# Patient Record
Sex: Female | Born: 1980 | Race: White | Hispanic: No | Marital: Married | Smoking: Never smoker
Health system: Southern US, Community
[De-identification: ages and names within clinical notes are randomized; demographics above are authoritative.]

## PROBLEM LIST (undated history)

## (undated) DIAGNOSIS — Z803 Family history of malignant neoplasm of breast: Secondary | ICD-10-CM

## (undated) DIAGNOSIS — Z8041 Family history of malignant neoplasm of ovary: Secondary | ICD-10-CM

## (undated) HISTORY — DX: Morbid (severe) obesity due to excess calories: E66.01

## (undated) HISTORY — DX: Family history of malignant neoplasm of ovary: Z80.41

## (undated) HISTORY — DX: Family history of malignant neoplasm of breast: Z80.3

---

## 2005-01-16 ENCOUNTER — Other Ambulatory Visit: Admission: RE | Admit: 2005-01-16 | Discharge: 2005-01-16 | Payer: Self-pay | Admitting: Family Medicine

## 2006-03-13 ENCOUNTER — Emergency Department: Payer: Self-pay | Admitting: Unknown Physician Specialty

## 2006-03-24 ENCOUNTER — Other Ambulatory Visit: Admission: RE | Admit: 2006-03-24 | Discharge: 2006-03-24 | Payer: Self-pay | Admitting: Family Medicine

## 2006-12-03 ENCOUNTER — Observation Stay: Payer: Self-pay | Admitting: Obstetrics & Gynecology

## 2006-12-05 ENCOUNTER — Inpatient Hospital Stay: Payer: Self-pay

## 2009-05-05 ENCOUNTER — Inpatient Hospital Stay: Payer: Self-pay | Admitting: *Deleted

## 2010-03-24 ENCOUNTER — Observation Stay: Payer: Self-pay

## 2010-03-28 ENCOUNTER — Inpatient Hospital Stay: Payer: Self-pay

## 2010-04-09 ENCOUNTER — Ambulatory Visit: Payer: Self-pay | Admitting: Obstetrics & Gynecology

## 2013-03-25 HISTORY — PX: BARIATRIC SURGERY: SHX1103

## 2013-11-11 ENCOUNTER — Ambulatory Visit: Payer: Self-pay | Admitting: Specialist

## 2013-11-12 ENCOUNTER — Ambulatory Visit: Payer: Self-pay | Admitting: Specialist

## 2016-06-06 ENCOUNTER — Ambulatory Visit (INDEPENDENT_AMBULATORY_CARE_PROVIDER_SITE_OTHER): Payer: BC Managed Care – PPO | Admitting: Certified Nurse Midwife

## 2016-06-06 ENCOUNTER — Encounter: Payer: Self-pay | Admitting: Certified Nurse Midwife

## 2016-06-06 VITALS — BP 104/64 | HR 65 | Ht 66.0 in | Wt 187.0 lb

## 2016-06-06 DIAGNOSIS — Z30433 Encounter for removal and reinsertion of intrauterine contraceptive device: Secondary | ICD-10-CM

## 2016-06-06 DIAGNOSIS — Z9884 Bariatric surgery status: Secondary | ICD-10-CM

## 2016-06-06 DIAGNOSIS — Z309 Encounter for contraceptive management, unspecified: Secondary | ICD-10-CM | POA: Insufficient documentation

## 2016-06-06 MED ORDER — LEVONORGESTREL 20 MCG/24HR IU IUD: 1.0000 | INTRAUTERINE_SYSTEM | Freq: Once | INTRAUTERINE | 0 refills | Status: AC

## 2016-06-06 NOTE — Progress Notes (Signed)
    GYNECOLOGY OFFICE PROCEDURE NOTE  Joan Kelly is a 36 y.o. [phone] here for Mirena IUD replacement. No GYN concerns. She is amenorrheic on the Mirena which was inserted 5 years ago. Her current IUD is her third Mirena IUD.  Last pap smear was on January 2017 and was normal.  IUD Insertion Procedure Note Patient identified, informed consent performed, consent signed.   Discussed risks of irregular bleeding, cramping, infection, expulsion,or malpositioning  of the IUD.     On bimanual exam, uterus was retroflexed. Speculum placed in the vagina.  Cervix visualized. THe IUD strings were visible. They were grasped with a uterine packing forceps and the expiring IUD was removed intact. The cervix was then cleaned with Betadine x 3. Cervix was sprayed with Hurricaine anesthetic and  grasped posteriorly with a single tooth tenaculum.  Uterus sounded to 6 cm.  Mirena  IUD placed per manufacturer's recommendations.  Strings trimmed to 3 cm. Tenaculum was removed, and silver nitrate was applied to tenaculum sites for hemostasis.  Patient tolerated procedure well.   Patient was given post-procedure instructions.  She was advised to have backup contraception for one week.  Patient was also asked to check IUD strings periodically and follow up in 8 weeks for IUD check and annual exam  Joan Kelly, CNM 06/06/16

## 2016-08-08 ENCOUNTER — Ambulatory Visit: Payer: BC Managed Care – PPO | Admitting: Certified Nurse Midwife

## 2019-04-11 ENCOUNTER — Encounter: Payer: Self-pay | Admitting: Certified Nurse Midwife

## 2019-05-24 DIAGNOSIS — Z9189 Other specified personal risk factors, not elsewhere classified: Secondary | ICD-10-CM

## 2019-05-24 DIAGNOSIS — Z1371 Encounter for nonprocreative screening for genetic disease carrier status: Secondary | ICD-10-CM

## 2019-05-24 HISTORY — DX: Encounter for nonprocreative screening for genetic disease carrier status: Z13.71

## 2019-05-24 HISTORY — DX: Other specified personal risk factors, not elsewhere classified: Z91.89

## 2019-06-06 NOTE — Progress Notes (Signed)
Gynecology Annual Exam  PCP: Patient, No Pcp Per  Chief Complaint:  Chief Complaint  Patient presents with  . Gynecologic Exam    last wk had sxs of yi - used like a 7d monistat    History of Present Illness:Joan Kelly presents today for her annual exam. She is a 39 year old Caucasian/White female, G2 P80.   She is having some problems with urinary frequency and urgency. She does drink 60 oz/day of water. Has no dysuria or nocturia.. Her menses are absent secondary to Mirena IUD. She has had no spotting. She denies dysmenorrhea.  The patient's past medical history is remarkable for obesity. She has had gastric sleeve surgery in 2015 and  lost 100 pounds. Unfortunately she has gained back 60# since her last annual 03/31/2015. She is sexually active and reports no problems with intercourse. M/m with spouse x 13 years. She is currently using an IUD for contraception. It was replaced 06/11/2016.  Her most recent Pap smear on 03/31/2015 was NIL/neg HRHPV. She has no history of abnormal pap smears.  She has not had a recent mammogram. There is a positive history a breast cancer in her maternal aunt and maternal grandmother.Her MGGM may have had ovarian or cervical cancer.  Genetic testing has not been done. The patient does not do monthly self breast exams.  The patient does not smoke.  The patient rarely drinks alcohol .  The patient does not use drugs.  The patient has not been exercising regularly. Her last lipid panel was 2015 and was normal.      Review of Systems: Review of Systems  Constitutional: Negative for chills, fever and weight loss.  HENT: Negative for congestion, sinus pain and sore throat.   Eyes: Negative for blurred vision and pain.  Respiratory: Negative for hemoptysis, shortness of breath and wheezing.   Cardiovascular: Negative for chest pain, palpitations and leg swelling.  Gastrointestinal: Negative for abdominal pain, blood in stool, diarrhea,  heartburn, nausea and vomiting.  Genitourinary: Positive for frequency and urgency. Negative for dysuria and hematuria.       Positive for amenorrhea.  Musculoskeletal: Negative for back pain, joint pain and myalgias.  Skin: Negative for itching and rash.  Neurological: Negative for dizziness, tingling and headaches.  Endo/Heme/Allergies: Negative for environmental allergies and polydipsia. Does not bruise/bleed easily.       Negative for hirsutism   Psychiatric/Behavioral: Negative for depression. The patient is not nervous/anxious and does not have insomnia.     Past Medical History:  Past Medical History:  Diagnosis Date  . Morbid obesity ([place])     Past Surgical History:  Past Surgical History:  Procedure Laterality Date  . BARIATRIC SURGERY  2015   vertical sleeve surgery    Family History:  Family History  Problem Relation Age of Onset  . Pulmonary fibrosis Father   . Breast cancer Maternal Aunt 50  . Lymphoma Maternal Aunt   . Cancer Maternal Aunt 45       spine  . Breast cancer Maternal Grandmother 60  . Hypertension Maternal Grandmother   . Dementia Maternal Grandmother   . Ovarian cancer Maternal Grandmother   . Brain cancer Maternal Grandmother 66  . Cervical cancer Other        vs ovarian cancer  . Bipolar disorder Mother   . Heart disease Maternal Grandfather   . Hyperlipidemia Maternal Grandfather   . Pulmonary fibrosis Paternal Aunt     Social History:  Social History   Socioeconomic History  . Marital status: Married    Spouse name: Not on file  . Number of children: 2  . Years of education: Not on file  . Highest education level: Not on file  Occupational History  . Occupation: Runner, broadcasting/film/video  Tobacco Use  . Smoking status: Never Smoker  . Smokeless tobacco: Never Used  Substance and Sexual Activity  . Alcohol use: No  . Drug use: No  . Sexual activity: Yes    Birth control/protection: I.U.D.  Other Topics Concern  . Not on file  Social  History Narrative  . Not on file   Social Determinants of Health   Financial Resource Strain:   . Difficulty of Paying Living Expenses:   Food Insecurity:   . Worried About Programme researcher, broadcasting/film/video in the Last Year:   . Barista in the Last Year:   Transportation Needs:   . Freight forwarder (Medical):   Marland Kitchen Lack of Transportation (Non-Medical):   Physical Activity:   . Days of Exercise per Week:   . Minutes of Exercise per Session:   Stress:   . Feeling of Stress :   Social Connections:   . Frequency of Communication with Friends and Family:   . Frequency of Social Gatherings with Friends and Family:   . Attends Religious Services:   . Active Member of Clubs or Organizations:   . Attends Banker Meetings:   Marland Kitchen Marital Status:   Intimate Partner Violence:   . Fear of Current or Ex-Partner:   . Emotionally Abused:   Marland Kitchen Physically Abused:   . Sexually Abused:     Allergies:  No Known Allergies  Medications: Prior to Admission medications   Medication Sig Start Date End Date Taking? Authorizing Provider  levonorgestrel (MIRENA, 52 MG,) 20 MCG/24HR IUD 1 Intra Uterine Device (1 each total) by Intrauterine route once. 06/06/16 06/06/16  Farrel Conners, CNM  Physical Exam Vitals: BP 118/70   Pulse 66   Ht 5\' 6"  (1.676 m)   Wt 237 lb (107.5 kg)   LMP  (LMP Unknown)   BMI 38.25 kg/m   General: WF in NAD HEENT: normocephalic, anicteric Neck: no thyroid enlargement, no palpable nodules, no cervical lymphadenopathy  Pulmonary: No increased work of breathing, CTAB Cardiovascular: RRR, without murmur  Breast: Breast symmetrical, no tenderness, no palpable nodules or masses, no skin or nipple retraction present, no nipple discharge.  No axillary, infraclavicular or supraclavicular lymphadenopathy. Abdomen: Soft, non-tender, non-distended.  Umbilicus without lesions.  No hepatomegaly or masses palpable. No evidence of hernia. Genitourinary:  External: Normal  external female genitalia.  Normal urethral meatus, normal Bartholin's and Skene's glands.    Vagina: Normal vaginal mucosa, no evidence of prolapse.    Cervix: Grossly normal in appearance, no bleeding, non-tender. IUD strings visible  Uterus: Retroverted, normal size, shape, and consistency, mobile, and non-tender  Adnexa: No adnexal masses, non-tender  Rectal: deferred  Lymphatic: no evidence of inguinal lymphadenopathy Extremities: no edema, erythema, or tenderness Neurologic: Grossly intact Psychiatric: mood appropriate, affect full  Results for orders placed or performed in visit on 06/07/19 (from the past 24 hour(s))  POCT urinalysis dipstick     Status: Normal   Collection Time: 06/07/19  4:31 PM  Result Value Ref Range   Color, UA     Clarity, UA     Glucose, UA Negative Negative   Bilirubin, UA neg    Ketones, UA neg  Spec Grav, UA 1.025 1.010 - 1.025   Blood, UA neg    pH, UA 5.0 5.0 - 8.0   Protein, UA Negative Negative   Urobilinogen, UA 0.2 0.2 or 1.0 E.U./dL   Nitrite, UA neg    Leukocytes, UA Negative Negative   Appearance     Odor       Assessment: 39 y.o. [id number] normal gyn exam Urinary frequency and urgency with normal urine dip Family history of breast cancer and possibly ovarian cancer  Plan:   1) Breast cancer screening - recommend monthly self breast exam. Discussed Myrisk testing due to family history. Explained increased surveillance and risk reduction if found to carry a mutation or risk is increased.  She desires Myrisk testing  2) Discussed calcium and vitamin D3 requirements and recommended taking vitamin D3 supplements.  3) Cervical cancer screening - Pap was done. ASCCP guidelines and rational discussed.  Patient opts for every 5 years screening interval  4) Contraception - Mirena expires in 3 years  5) Routine healthcare maintenance including cholesterol and diabetes screening to be drwn when she returns for [facility] testing. (when  fasting). Will have her return in 6 weeks for [facility] results.   Farrel Conners, CNM

## 2019-06-07 ENCOUNTER — Ambulatory Visit (INDEPENDENT_AMBULATORY_CARE_PROVIDER_SITE_OTHER): Payer: BC Managed Care – PPO | Admitting: Certified Nurse Midwife

## 2019-06-07 ENCOUNTER — Encounter: Payer: Self-pay | Admitting: Certified Nurse Midwife

## 2019-06-07 ENCOUNTER — Other Ambulatory Visit (HOSPITAL_COMMUNITY)
Admission: RE | Admit: 2019-06-07 | Discharge: 2019-06-07 | Disposition: A | Discharge status: Home / Self Care | Payer: BC Managed Care – PPO | Source: Ambulatory Visit | Attending: Certified Nurse Midwife | Admitting: Certified Nurse Midwife

## 2019-06-07 ENCOUNTER — Other Ambulatory Visit: Payer: Self-pay

## 2019-06-07 VITALS — BP 118/70 | HR 66 | Ht 66.0 in | Wt 237.0 lb

## 2019-06-07 DIAGNOSIS — Z01419 Encounter for gynecological examination (general) (routine) without abnormal findings: Secondary | ICD-10-CM

## 2019-06-07 DIAGNOSIS — Z975 Presence of (intrauterine) contraceptive device: Secondary | ICD-10-CM

## 2019-06-07 DIAGNOSIS — Z124 Encounter for screening for malignant neoplasm of cervix: Secondary | ICD-10-CM

## 2019-06-07 DIAGNOSIS — R350 Frequency of micturition: Secondary | ICD-10-CM

## 2019-06-07 DIAGNOSIS — Z13220 Encounter for screening for lipoid disorders: Secondary | ICD-10-CM

## 2019-06-07 DIAGNOSIS — Z131 Encounter for screening for diabetes mellitus: Secondary | ICD-10-CM

## 2019-06-07 DIAGNOSIS — Z803 Family history of malignant neoplasm of breast: Secondary | ICD-10-CM | POA: Diagnosis not present

## 2019-06-07 LAB — POCT URINALYSIS DIPSTICK
Bilirubin, UA: NEGATIVE
Blood, UA: NEGATIVE
Glucose, UA: NEGATIVE
Ketones, UA: NEGATIVE
Leukocytes, UA: NEGATIVE
Nitrite, UA: NEGATIVE
Protein, UA: NEGATIVE
Spec Grav, UA: 1.025 (ref 1.010–1.025)
Urobilinogen, UA: 0.2 E.U./dL
pH, UA: 5 (ref 5.0–8.0)

## 2019-06-09 LAB — CYTOLOGY - PAP
Comment: NEGATIVE
Diagnosis: NEGATIVE
High risk HPV: NEGATIVE

## 2019-06-16 ENCOUNTER — Other Ambulatory Visit: Payer: Self-pay

## 2019-06-16 ENCOUNTER — Other Ambulatory Visit: Payer: BC Managed Care – PPO

## 2019-06-16 ENCOUNTER — Ambulatory Visit: Payer: BC Managed Care – PPO

## 2019-06-16 ENCOUNTER — Encounter: Payer: Self-pay | Admitting: Certified Nurse Midwife

## 2019-06-16 DIAGNOSIS — Z131 Encounter for screening for diabetes mellitus: Secondary | ICD-10-CM

## 2019-06-16 DIAGNOSIS — Z13220 Encounter for screening for lipoid disorders: Secondary | ICD-10-CM

## 2019-06-17 LAB — LIPID PANEL WITH LDL/HDL RATIO
Cholesterol, Total: 209 mg/dL — ABNORMAL HIGH (ref 100–199)
HDL: 52 mg/dL (ref >39)
LDL Chol Calc (NIH): 133 mg/dL — ABNORMAL HIGH (ref 0–99)
LDL/HDL Ratio: 2.6 ratio (ref 0.0–3.2)
Triglycerides: 135 mg/dL (ref 0–149)
VLDL Cholesterol Cal: 24 mg/dL (ref 5–40)

## 2019-06-17 LAB — HEMOGLOBIN A1C
Est. average glucose Bld gHb Est-mCnc: 97 mg/dL
Hgb A1c MFr Bld: 5 % (ref 4.8–5.6)

## 2019-06-24 ENCOUNTER — Encounter: Payer: Self-pay | Admitting: Obstetrics and Gynecology

## 2019-08-13 ENCOUNTER — Other Ambulatory Visit: Payer: Self-pay | Admitting: Certified Nurse Midwife

## 2019-08-13 DIAGNOSIS — Z1231 Encounter for screening mammogram for malignant neoplasm of breast: Secondary | ICD-10-CM

## 2019-09-08 ENCOUNTER — Ambulatory Visit
Admission: RE | Admit: 2019-09-08 | Discharge: 2019-09-08 | Disposition: A | Discharge status: Home / Self Care | Payer: BC Managed Care – PPO | Source: Ambulatory Visit | Attending: Certified Nurse Midwife | Admitting: Certified Nurse Midwife

## 2019-09-08 DIAGNOSIS — Z1231 Encounter for screening mammogram for malignant neoplasm of breast: Secondary | ICD-10-CM | POA: Insufficient documentation

## 2019-09-10 ENCOUNTER — Other Ambulatory Visit: Payer: Self-pay | Admitting: Certified Nurse Midwife

## 2019-09-10 DIAGNOSIS — R928 Other abnormal and inconclusive findings on diagnostic imaging of breast: Secondary | ICD-10-CM

## 2019-09-10 DIAGNOSIS — N6489 Other specified disorders of breast: Secondary | ICD-10-CM

## 2019-09-17 ENCOUNTER — Ambulatory Visit
Admission: RE | Admit: 2019-09-17 | Discharge: 2019-09-17 | Disposition: A | Discharge status: Home / Self Care | Payer: BC Managed Care – PPO | Source: Ambulatory Visit | Attending: Certified Nurse Midwife | Admitting: Certified Nurse Midwife

## 2019-09-17 DIAGNOSIS — N6489 Other specified disorders of breast: Secondary | ICD-10-CM

## 2019-09-17 DIAGNOSIS — R928 Other abnormal and inconclusive findings on diagnostic imaging of breast: Secondary | ICD-10-CM

## 2020-08-14 ENCOUNTER — Other Ambulatory Visit: Payer: Self-pay

## 2020-08-14 ENCOUNTER — Ambulatory Visit (INDEPENDENT_AMBULATORY_CARE_PROVIDER_SITE_OTHER): Payer: BC Managed Care – PPO | Admitting: Obstetrics and Gynecology

## 2020-08-14 ENCOUNTER — Encounter: Payer: Self-pay | Admitting: Obstetrics and Gynecology

## 2020-08-14 VITALS — BP 120/70 | Ht 66.0 in | Wt 259.0 lb

## 2020-08-14 DIAGNOSIS — R059 Cough, unspecified: Secondary | ICD-10-CM

## 2020-08-14 DIAGNOSIS — N6489 Other specified disorders of breast: Secondary | ICD-10-CM

## 2020-08-14 DIAGNOSIS — Z01419 Encounter for gynecological examination (general) (routine) without abnormal findings: Secondary | ICD-10-CM | POA: Diagnosis not present

## 2020-08-14 DIAGNOSIS — L719 Rosacea, unspecified: Secondary | ICD-10-CM

## 2020-08-14 DIAGNOSIS — Z1231 Encounter for screening mammogram for malignant neoplasm of breast: Secondary | ICD-10-CM | POA: Diagnosis not present

## 2020-08-14 DIAGNOSIS — Z9189 Other specified personal risk factors, not elsewhere classified: Secondary | ICD-10-CM | POA: Diagnosis not present

## 2020-08-14 DIAGNOSIS — Z30431 Encounter for routine checking of intrauterine contraceptive device: Secondary | ICD-10-CM

## 2020-08-14 DIAGNOSIS — Z803 Family history of malignant neoplasm of breast: Secondary | ICD-10-CM | POA: Diagnosis not present

## 2020-08-14 MED ORDER — METRONIDAZOLE 0.75 % EX GEL: 1.0000 "application " | Freq: Every day | CUTANEOUS | 1 refills | Status: DC

## 2020-08-14 NOTE — Patient Instructions (Addendum)
I value your feedback and you entrusting us with your care. If you get a [facility] patient survey, I would appreciate you taking the time to let us know about your experience today. Thank you!  [facility]: [phone]      

## 2020-08-14 NOTE — Progress Notes (Signed)
PCP:  Dalia Heading, CNM (Inactive)   Chief Complaint  Patient presents with  . Gynecologic Exam    No concerns     HPI:      Ms. Joan Kelly is a 40 y.o. [id number] whose LMP was No LMP recorded. (Menstrual status: IUD)., presents today for her annual examination.  Her menses are absent with IUD. She does not have intermenstrual bleeding.  Sex activity: single partner, contraception - IUD. Mirena placed 06/11/16 Last Pap: 06/07/19  Results were: no abnormalities /neg HPV DNA . No hx of abn paps  Last mammogram: 09/17/19  Results were: cat 3 LT breast asymmetry; f/u mammo due 12/21, not done. Due for bilat mammo 6/22. There is a FH of breast cancer in her mat aunt and MGM, and possibly ovarian cancer in her [place]. Genetic testing done by pt 3/21 and was MyRisk neg. IBIS=20.3%/riskscore=35.4%. The patient does not do self-breast exams.  Tobacco use: The patient denies current or previous tobacco use. Alcohol use: none No drug use.  Exercise: not active  She does get adequate calcium and Vitamin D in her diet. Needs Rx RF metrogel for rosacea. Uses nightly when has a flare or prn.   Had URI/sinusitis a month or so ago with lingering dry cough. Sx improved when went to beach a couple wks ago, but has now returned with PND with cough in AM, and raspy voice. No sinus pain. Taking OTC meds. FH of pulmonary fibrosis so concerned about that.   Past Medical History:  Diagnosis Date  . BRCA negative 05/2019   MyRisk neg  . Family history of breast cancer   . Family history of ovarian cancer   . Increased risk of breast cancer 05/2019   IBIS=20.3%/riskscore=35.4%  . Morbid obesity ([place])     Past Surgical History:  Procedure Laterality Date  . BARIATRIC SURGERY  2015   vertical sleeve surgery    Family History  Problem Relation Age of Onset  . Pulmonary fibrosis Father   . Breast cancer Maternal Aunt 55  . Lymphoma Maternal Aunt   . Cancer Maternal Aunt 45        spine  . Breast cancer Maternal Grandmother 60  . Hypertension Maternal Grandmother   . Dementia Maternal Grandmother   . Ovarian cancer Maternal Grandmother   . Brain cancer Maternal Grandmother 2  . Cervical cancer Other        vs ovarian cancer, does not know age  . Bipolar disorder Mother   . Heart disease Maternal Grandfather   . Hyperlipidemia Maternal Grandfather   . Pulmonary fibrosis Paternal Aunt     Social History   Socioeconomic History  . Marital status: Married    Spouse name: Not on file  . Number of children: 2  . Years of education: Not on file  . Highest education level: Not on file  Occupational History  . Occupation: Pharmacist, hospital  Tobacco Use  . Smoking status: Never Smoker  . Smokeless tobacco: Never Used  Vaping Use  . Vaping Use: Never used  Substance and Sexual Activity  . Alcohol use: No  . Drug use: No  . Sexual activity: Yes    Birth control/protection: I.U.D.    Comment: Mirena  Other Topics Concern  . Not on file  Social History Narrative  . Not on file   Social Determinants of Health   Financial Resource Strain: Not on file  Food Insecurity: Not on file  Transportation Needs: Not on file  Physical Activity: Not on file  Stress: Not on file  Social Connections: Not on file  Intimate Partner Violence: Not on file     Current Outpatient Medications:  Marland Kitchen  Multiple Vitamins-Minerals (MULTIVITAMIN ADULT PO), Take by mouth., Disp: , Rfl:  .  levonorgestrel (MIRENA, 52 MG,) 20 MCG/24HR IUD, 1 Intra Uterine Device (1 each total) by Intrauterine route once., Disp: 1 each, Rfl: 0 .  metroNIDAZOLE (METROGEL) 0.75 % gel, Apply 1 application topically at bedtime., Disp: 45 g, Rfl: 1     ROS:  Review of Systems  Constitutional: Negative for fatigue, fever and unexpected weight change.  HENT: Positive for postnasal drip.   Respiratory: Positive for cough. Negative for shortness of breath and wheezing.   Cardiovascular: Negative for chest  pain, palpitations and leg swelling.  Gastrointestinal: Negative for blood in stool, constipation, diarrhea, nausea and vomiting.  Endocrine: Negative for cold intolerance, heat intolerance and polyuria.  Genitourinary: Negative for dyspareunia, dysuria, flank pain, frequency, genital sores, hematuria, menstrual problem, pelvic pain, urgency, vaginal bleeding, vaginal discharge and vaginal pain.  Musculoskeletal: Negative for back pain, joint swelling and myalgias.  Skin: Negative for rash.  Neurological: Negative for dizziness, syncope, light-headedness, numbness and headaches.  Hematological: Negative for adenopathy.  Psychiatric/Behavioral: Negative for agitation, confusion, sleep disturbance and suicidal ideas. The patient is not nervous/anxious.   BREAST: No symptoms   Objective: BP 120/70   Ht 5' 6"  (1.676 m)   Wt 259 lb (117.5 kg)   BMI 41.80 kg/m    Physical Exam Constitutional:      Appearance: She is well-developed.  Genitourinary:     Vulva normal.     Right Labia: No rash, tenderness or lesions.    Left Labia: No tenderness, lesions or rash.    No vaginal discharge, erythema or tenderness.      Right Adnexa: not tender and no mass present.    Left Adnexa: not tender and no mass present.    No cervical friability or polyp.     IUD strings visualized.     Uterus is not enlarged or tender.  Breasts:     Right: No mass, nipple discharge, skin change or tenderness.     Left: No mass, nipple discharge, skin change or tenderness.    Neck:     Thyroid: No thyromegaly.  Cardiovascular:     Rate and Rhythm: Normal rate and regular rhythm.     Heart sounds: Normal heart sounds. No murmur heard.   Pulmonary:     Effort: Pulmonary effort is normal.     Breath sounds: Normal breath sounds.  Abdominal:     Palpations: Abdomen is soft.     Tenderness: There is no abdominal tenderness. There is no guarding or rebound.  Musculoskeletal:        General: Normal range of  motion.     Cervical back: Normal range of motion.  Lymphadenopathy:     Cervical: No cervical adenopathy.  Neurological:     General: No focal deficit present.     Mental Status: She is alert and oriented to person, place, and time.     Cranial Nerves: No cranial nerve deficit.  Skin:    General: Skin is warm and dry.  Psychiatric:        Mood and Affect: Mood normal.        Behavior: Behavior normal.        Thought Content: Thought content normal.        Judgment:  Judgment normal.  Vitals reviewed.     Assessment/Plan: Encounter for annual routine gynecological examination  Encounter for screening mammogram for malignant neoplasm of breast - Plan: MM DIAG BREAST TOMO BILATERAL; pt to sched mammo   Family history of breast cancer - Plan: MM DIAG BREAST TOMO BILATERAL; Pt is MyRisk neg  Increased risk of breast cancer - Plan: MM DIAG BREAST TOMO BILATERAL; recommended monthly SBE, yearly CBE and mammos, as well as scr breast MRI. Pt to do mammo first and then f/u for MRI ref prn. Cont Vit D supp  Breast asymmetry - Plan: MM DIAG BREAST TOMO BILATERAL; needs f/u mammo  Encounter for routine checking of intrauterine contraceptive device (IUD)--IUD strings in cx os. Good till 05/2023  Rosacea - Plan: metroNIDAZOLE (METROGEL) 0.75 % gel; Rx RF.   Cough--neg lung exam. Most likely PND. Cont OTC meds, f/u prn.   Meds ordered this encounter  Medications  . metroNIDAZOLE (METROGEL) 0.75 % gel    Sig: Apply 1 application topically at bedtime.    Dispense:  45 g    Refill:  1    Order Specific Question:   Supervising Provider    Answer:   Gae Dry [[id number]]             GYN counsel breast self exam, mammography screening, adequate intake of calcium and vitamin D, diet and exercise     F/U  Return in about 1 year (around 08/14/2021).  Sitlaly Gudiel B. Micayla Brathwaite, PA-C 08/14/2020 4:48 PM

## 2020-09-19 ENCOUNTER — Other Ambulatory Visit: Payer: Self-pay

## 2020-09-19 ENCOUNTER — Ambulatory Visit
Admission: RE | Admit: 2020-09-19 | Discharge: 2020-09-19 | Disposition: A | Discharge status: Home / Self Care | Payer: BC Managed Care – PPO | Source: Ambulatory Visit | Attending: Obstetrics and Gynecology | Admitting: Obstetrics and Gynecology

## 2020-09-19 DIAGNOSIS — Z1231 Encounter for screening mammogram for malignant neoplasm of breast: Secondary | ICD-10-CM

## 2020-09-19 DIAGNOSIS — Z9189 Other specified personal risk factors, not elsewhere classified: Secondary | ICD-10-CM | POA: Diagnosis present

## 2020-09-19 DIAGNOSIS — Z803 Family history of malignant neoplasm of breast: Secondary | ICD-10-CM | POA: Insufficient documentation

## 2020-09-19 DIAGNOSIS — N6489 Other specified disorders of breast: Secondary | ICD-10-CM

## 2020-10-23 ENCOUNTER — Ambulatory Visit: Payer: BC Managed Care – PPO | Admitting: Dermatology

## 2021-09-25 IMAGING — MG DIGITAL DIAGNOSTIC BILAT W/ TOMO W/ CAD
8 series · 8 of 24 positions shown · non-contrast
Comparison: Previous exam(s).

CLINICAL DATA: Short-term follow-up for a probably benign left
breast asymmetry. This patient underwent a baseline screening
mammogram on 09/08/2019, at which time a possible asymmetry was
noted in the left breast. Follow-up diagnostic imaging on 09/17/2019
showed a persistent left breast asymmetry, only visible on the cc
view, without sonographic correlate, for which short-term follow-up
was recommended. This is the patient's first return visit since the
prior diagnostic study.

EXAM:
DIGITAL DIAGNOSTIC BILATERAL MAMMOGRAM WITH TOMOSYNTHESIS AND CAD
TECHNIQUE: Bilateral digital diagnostic mammography and breast tomosynthesis
was performed. The images were evaluated with computer-aided
detection.

[R CC synth-2D]
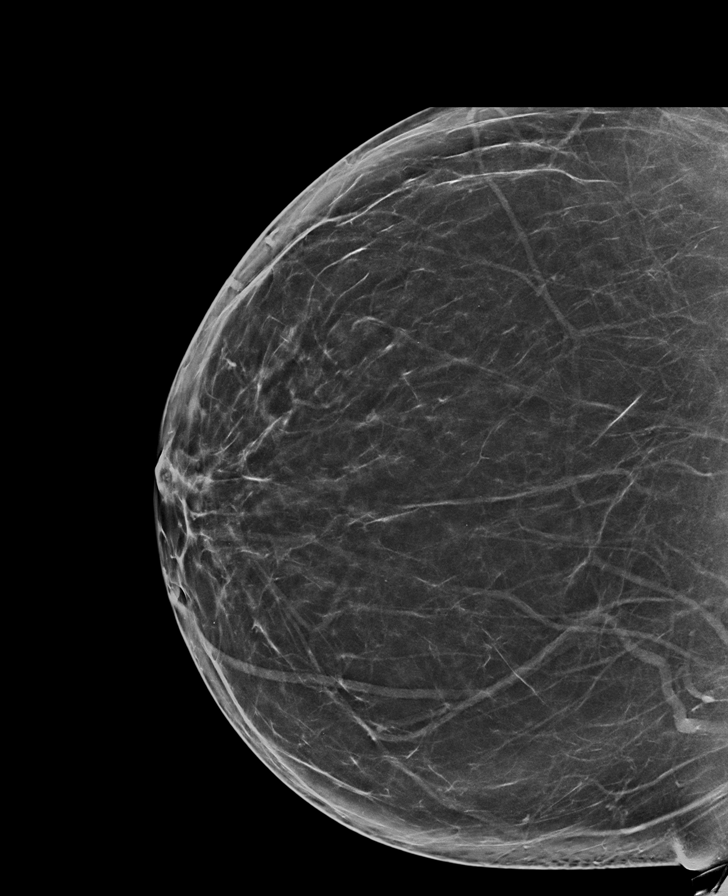

[L CC synth-2D]
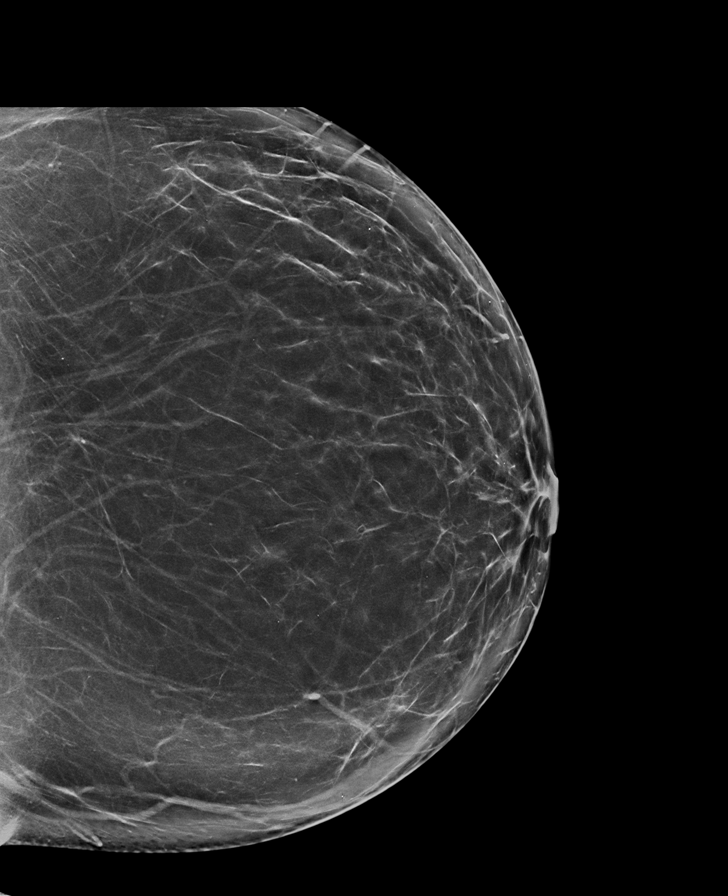

[R MLO synth-2D]
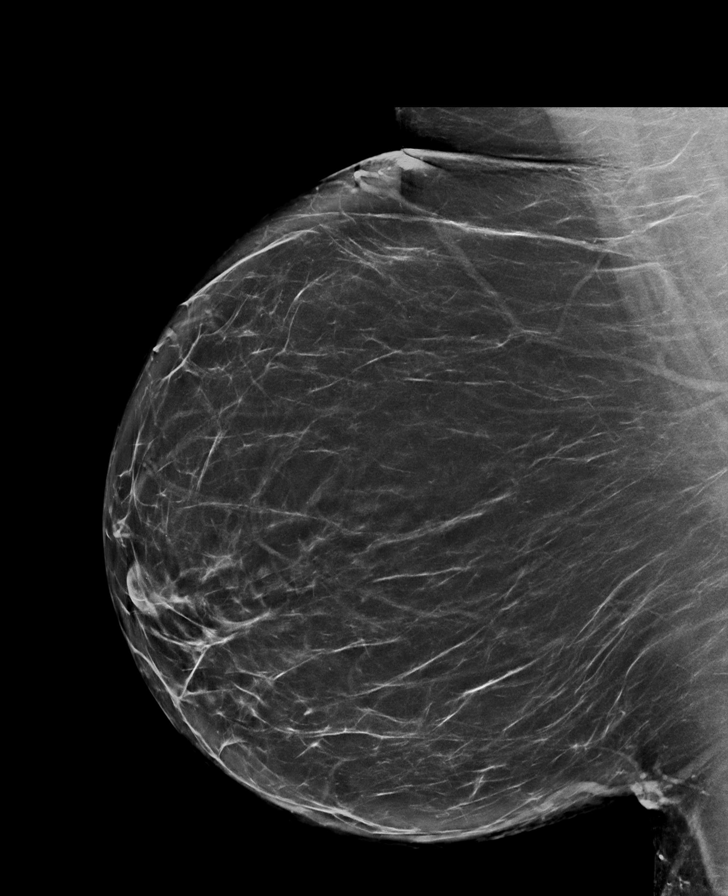

[L MLO synth-2D]
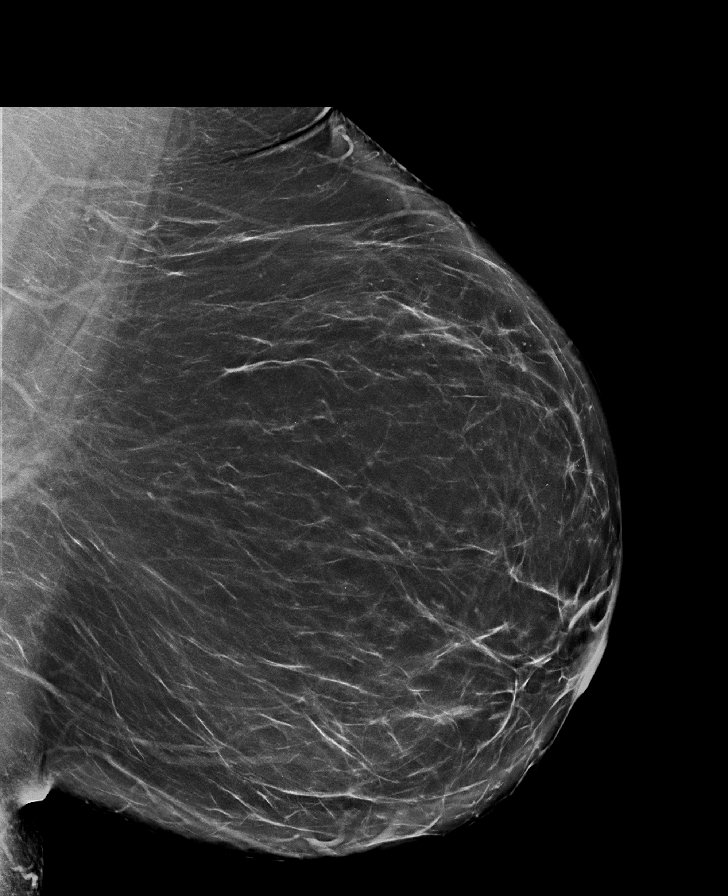

[R MLO tomo · tomo slice 51/101.0]
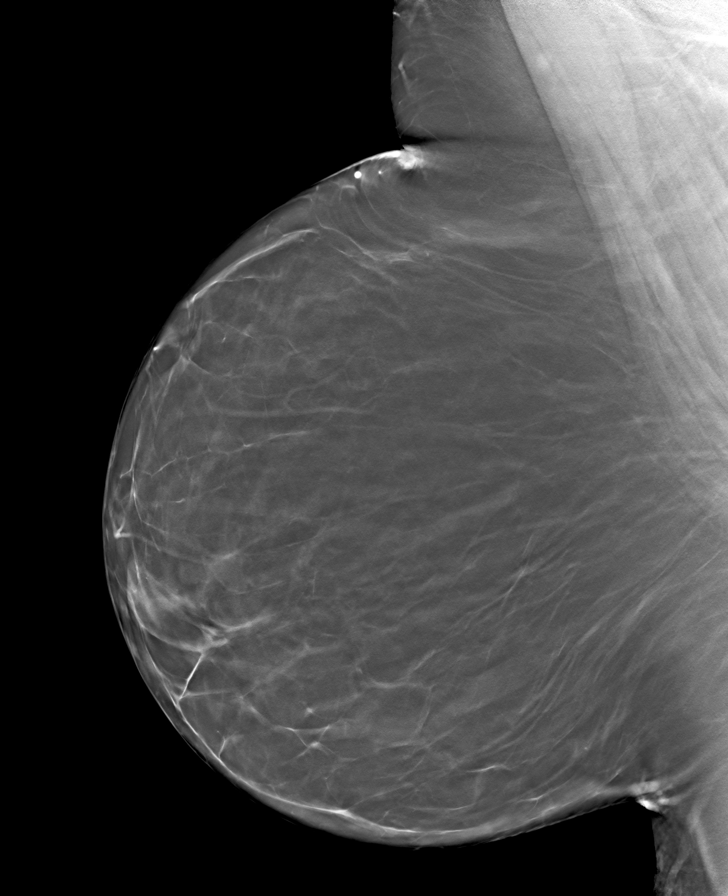

[R CC tomo · tomo slice 44/87.0]
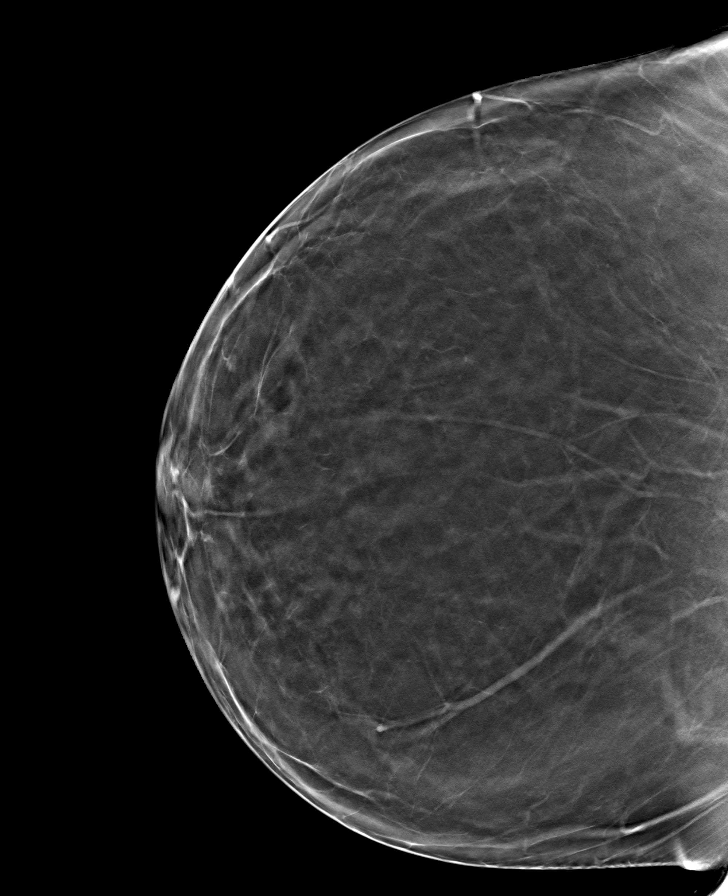

[L CC tomo · tomo slice 47/92.0]
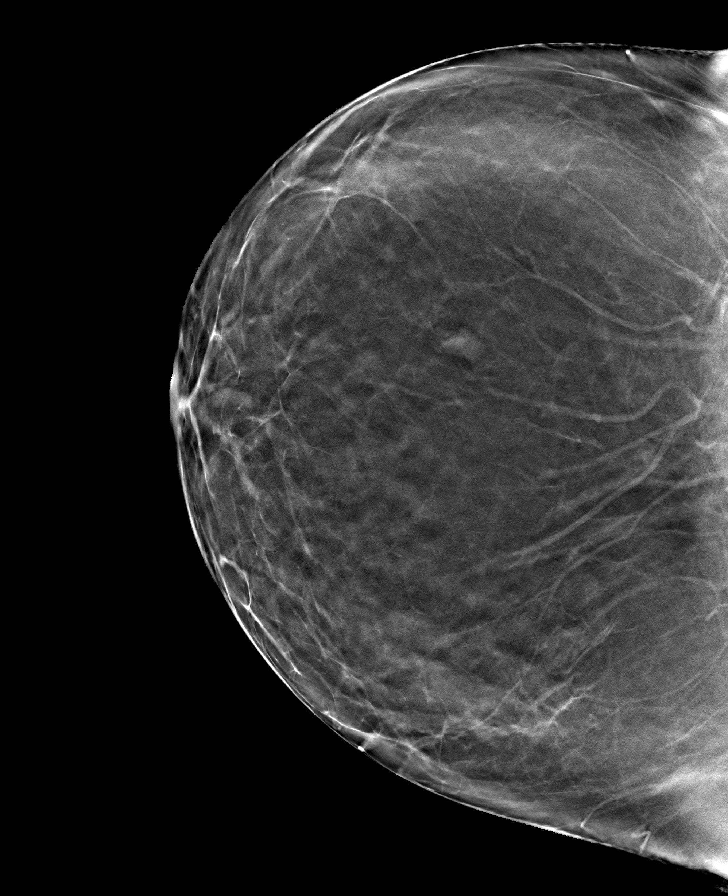

[L MLO tomo · tomo slice 49/98.0]
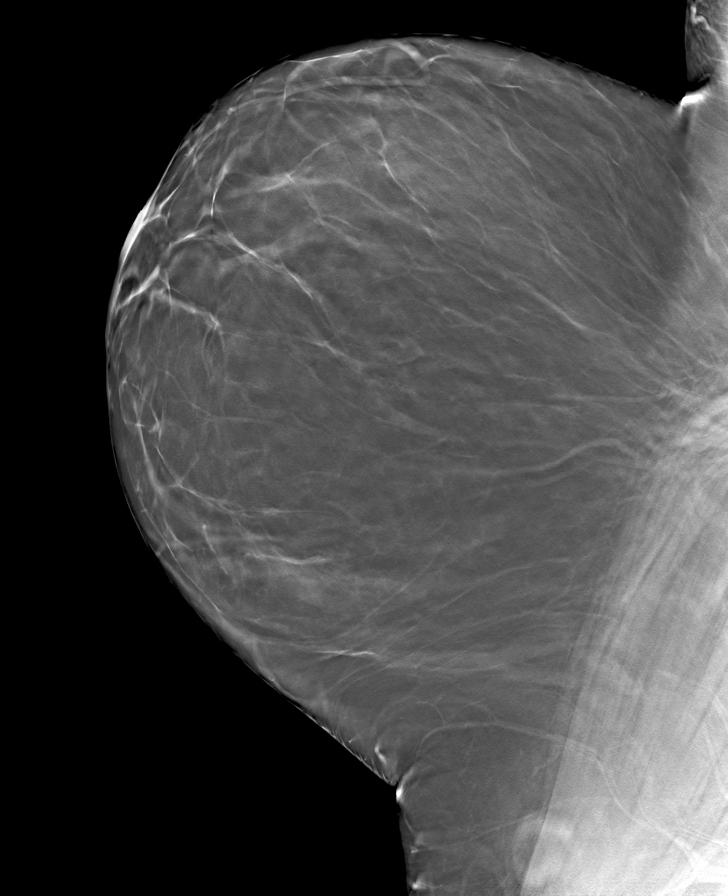

[8 of 24 positions shown; findings below may reference images not displayed]

ACR Breast Density Category b: There are scattered areas of
fibroglandular density.
FINDINGS: The small asymmetry in the lower slightly inner aspect of the left
breast, visible on the cc view only, is unchanged from the prior
exams.

There are no masses, other areas of asymmetry, areas of
architectural distortion or suspicious calcifications. No
mammographic change.
IMPRESSION: 1. Probably benign area of asymmetry in the left breast, stable for
1 year.
2. No new abnormalities.

RECOMMENDATION:
Diagnostic mammography in 1 year to provide 2 years of stability for
the probably benign left breast asymmetry.

I have discussed the findings and recommendations with the patient.
If applicable, a reminder letter will be sent to the patient
regarding the next appointment.

BI-RADS CATEGORY  3: Probably benign.

## 2021-11-14 NOTE — Progress Notes (Unsigned)
PCP:  Chad Cordial, PA-C   No chief complaint on file.    HPI:      Ms. Joan Kelly is a 41 y.o. [id number] whose LMP was No LMP recorded. (Menstrual status: IUD)., presents today for her annual examination.  Her menses are absent with IUD. She does not have intermenstrual bleeding.  Sex activity: single partner, contraception - IUD. Mirena placed 06/11/16 Last Pap: 06/07/19  Results were: no abnormalities /neg HPV DNA . No hx of abn paps  Last mammogram: 09/19/20 and 09/08/19 Results were cat 3 LT breast asymmetry; f/u dx mammo due 6/23 There is a FH of breast cancer in her mat aunt and MGM, and possibly ovarian cancer in her [place]. Genetic testing done by pt 3/21 and was MyRisk neg. IBIS=20.3%/riskscore=35.4%. The patient does not do self-breast exams.  Tobacco use: The patient denies current or previous tobacco use. Alcohol use: none No drug use.  Exercise: not active  She does get adequate calcium and Vitamin D in her diet. Needs Rx RF metrogel for rosacea. Uses nightly when has a flare or prn.   Had URI/sinusitis a month or so ago with lingering dry cough. Sx improved when went to beach a couple wks ago, but has now returned with PND with cough in AM, and raspy voice. No sinus pain. Taking OTC meds. FH of pulmonary fibrosis so concerned about that.   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    Family History  Problem Relation Age of Onset   Pulmonary fibrosis Father    Breast cancer Maternal Aunt 45   Lymphoma Maternal Aunt    Cancer Maternal Aunt 45       spine   Breast cancer Maternal Grandmother 60   Hypertension Maternal Grandmother     Dementia Maternal Grandmother    Ovarian cancer Maternal Grandmother    Brain cancer Maternal Grandmother 69   Cervical cancer Other        vs ovarian cancer, does not know age   Bipolar disorder Mother    Heart disease Maternal Grandfather    Hyperlipidemia Maternal Grandfather    Pulmonary fibrosis Paternal Aunt     Social History   Socioeconomic History   Marital status: Married    Spouse name: Not on file   Number of children: 2   Years of education: Not on file   Highest education level: Not on file  Occupational History   Occupation: Pharmacist, hospital  Tobacco Use   Smoking status: Never   Smokeless tobacco: Never  Vaping Use   Vaping Use: Never used  Substance and Sexual Activity   Alcohol use: No   Drug use: No   Sexual activity: Yes    Birth control/protection: I.U.D.    Comment: Mirena  Other Topics Concern   Not on file  Social History Narrative   Not on file   Social Determinants of Health   Financial Resource Strain: Not on file  Food Insecurity: Not on file  Transportation Needs: Not on file  Physical Activity: Not on file  Stress: Not on file  Social Connections: Not on file  Intimate Partner Violence: Not on file     Current Outpatient Medications:    levonorgestrel (MIRENA, 52 MG,) 20 MCG/24HR IUD, 1 Intra Uterine Device (1 each total) by Intrauterine route once., Disp: 1 each, Rfl: 0   metroNIDAZOLE (METROGEL) 0.75 % gel, Apply 1 application topically at bedtime., Disp: 45 g, Rfl: 1   Multiple Vitamins-Minerals (MULTIVITAMIN ADULT PO), Take by mouth., Disp: , Rfl:      ROS:  Review of Systems  Constitutional:  Negative for fatigue, fever and unexpected weight change.  HENT:  Positive for postnasal drip.   Respiratory:  Positive for cough. Negative for shortness of breath and wheezing.   Cardiovascular:  Negative for chest pain, palpitations and leg swelling.  Gastrointestinal:  Negative for blood in stool, constipation, diarrhea, nausea and  vomiting.  Endocrine: Negative for cold intolerance, heat intolerance and polyuria.  Genitourinary:  Negative for dyspareunia, dysuria, flank pain, frequency, genital sores, hematuria, menstrual problem, pelvic pain, urgency, vaginal bleeding, vaginal discharge and vaginal pain.  Musculoskeletal:  Negative for back pain, joint swelling and myalgias.  Skin:  Negative for rash.  Neurological:  Negative for dizziness, syncope, light-headedness, numbness and headaches.  Hematological:  Negative for adenopathy.  Psychiatric/Behavioral:  Negative for agitation, confusion, sleep disturbance and suicidal ideas. The patient is not nervous/anxious.   BREAST: No symptoms   Objective: There were no vitals taken for this visit.   Physical Exam Constitutional:      Appearance: She is well-developed.  Genitourinary:     Vulva normal.     Right Labia: No rash, tenderness or lesions.    Left Labia: No tenderness, lesions or rash.    No vaginal discharge, erythema or tenderness.      Right Adnexa: not tender and no mass present.    Left Adnexa: not tender and no mass present.    No cervical friability or polyp.     IUD strings visualized.     Uterus is not enlarged or tender.  Breasts:    Right: No mass, nipple discharge, skin change or tenderness.     Left: No mass, nipple discharge, skin change or tenderness.  Neck:     Thyroid: No thyromegaly.  Cardiovascular:     Rate and Rhythm: Normal rate and regular rhythm.     Heart sounds: Normal heart sounds. No murmur heard. Pulmonary:     Effort: Pulmonary effort is normal.     Breath sounds: Normal breath sounds.  Abdominal:     Palpations: Abdomen is soft.     Tenderness: There is no abdominal tenderness. There is no guarding or rebound.  Musculoskeletal:        General: Normal range of motion.     Cervical back: Normal range of motion.  Lymphadenopathy:     Cervical: No cervical adenopathy.  Neurological:     General: No focal deficit  present.     Mental Status: She is alert and oriented to person, place, and time.     Cranial Nerves: No cranial nerve deficit.  Skin:    General: Skin is warm and dry.  Psychiatric:        Mood and Affect: Mood normal.        Behavior: Behavior normal.        Thought Content: Thought content normal.        Judgment: Judgment normal.  Vitals reviewed.     Assessment/Plan: Encounter for annual routine gynecological examination  Encounter for screening mammogram for malignant neoplasm of  breast - Plan: MM DIAG BREAST TOMO BILATERAL; pt to sched mammo   Family history of breast cancer - Plan: MM DIAG BREAST TOMO BILATERAL; Pt is MyRisk neg  Increased risk of breast cancer - Plan: MM DIAG BREAST TOMO BILATERAL; recommended monthly SBE, yearly CBE and mammos, as well as scr breast MRI. Pt to do mammo first and then f/u for MRI ref prn. Cont Vit D supp  Breast asymmetry - Plan: MM DIAG BREAST TOMO BILATERAL; needs f/u mammo  Encounter for routine checking of intrauterine contraceptive device (IUD)--IUD strings in cx os. Good till 05/2023  Rosacea - Plan: metroNIDAZOLE (METROGEL) 0.75 % gel; Rx RF.   Cough--neg lung exam. Most likely PND. Cont OTC meds, f/u prn.   No orders of the defined types were placed in this encounter.            GYN counsel breast self exam, mammography screening, adequate intake of calcium and vitamin D, diet and exercise     F/U  No follow-ups on file.  Merly Hinkson B. Norva Bowe, PA-C 11/14/2021 4:03 PM

## 2021-11-15 ENCOUNTER — Ambulatory Visit (INDEPENDENT_AMBULATORY_CARE_PROVIDER_SITE_OTHER): Payer: BC Managed Care – PPO | Admitting: Obstetrics and Gynecology

## 2021-11-15 ENCOUNTER — Encounter: Payer: Self-pay | Admitting: Obstetrics and Gynecology

## 2021-11-15 VITALS — BP 120/84 | Ht 66.0 in | Wt 264.0 lb

## 2021-11-15 DIAGNOSIS — Z0000 Encounter for general adult medical examination without abnormal findings: Secondary | ICD-10-CM

## 2021-11-15 DIAGNOSIS — Z1231 Encounter for screening mammogram for malignant neoplasm of breast: Secondary | ICD-10-CM | POA: Diagnosis not present

## 2021-11-15 DIAGNOSIS — Z13220 Encounter for screening for lipoid disorders: Secondary | ICD-10-CM

## 2021-11-15 DIAGNOSIS — L719 Rosacea, unspecified: Secondary | ICD-10-CM

## 2021-11-15 DIAGNOSIS — Z30431 Encounter for routine checking of intrauterine contraceptive device: Secondary | ICD-10-CM

## 2021-11-15 DIAGNOSIS — Z6841 Body Mass Index (BMI) 40.0 and over, adult: Secondary | ICD-10-CM

## 2021-11-15 DIAGNOSIS — Z01419 Encounter for gynecological examination (general) (routine) without abnormal findings: Secondary | ICD-10-CM

## 2021-11-15 DIAGNOSIS — N6489 Other specified disorders of breast: Secondary | ICD-10-CM

## 2021-11-15 DIAGNOSIS — Z803 Family history of malignant neoplasm of breast: Secondary | ICD-10-CM

## 2021-11-15 DIAGNOSIS — Z131 Encounter for screening for diabetes mellitus: Secondary | ICD-10-CM

## 2021-11-15 DIAGNOSIS — Z9189 Other specified personal risk factors, not elsewhere classified: Secondary | ICD-10-CM

## 2021-11-15 MED ORDER — METRONIDAZOLE 0.75 % EX GEL
1.0000 | Freq: Every day | CUTANEOUS | 1 refills | Status: DC
Start: 2021-11-15 — End: 2023-03-06

## 2021-11-15 NOTE — Addendum Note (Signed)
Addended by: Althea Grimmer B on: 11/15/2021 02:19 PM   Modules accepted: Orders

## 2021-11-15 NOTE — Patient Instructions (Signed)
I value your feedback and you entrusting us with your care. If you get a [facility] patient survey, I would appreciate you taking the time to let us know about your experience today. Thank you!  [facility]: [phone]      

## 2021-11-21 ENCOUNTER — Other Ambulatory Visit: Payer: BC Managed Care – PPO

## 2021-11-21 DIAGNOSIS — Z131 Encounter for screening for diabetes mellitus: Secondary | ICD-10-CM

## 2021-11-21 DIAGNOSIS — Z0000 Encounter for general adult medical examination without abnormal findings: Secondary | ICD-10-CM

## 2021-11-21 DIAGNOSIS — Z6841 Body Mass Index (BMI) 40.0 and over, adult: Secondary | ICD-10-CM

## 2021-11-21 DIAGNOSIS — Z13220 Encounter for screening for lipoid disorders: Secondary | ICD-10-CM

## 2021-11-22 LAB — COMPREHENSIVE METABOLIC PANEL
ALT: 19 IU/L (ref 0–32)
AST: 25 IU/L (ref 0–40)
Albumin/Globulin Ratio: 1.6 (ref 1.2–2.2)
Albumin: 4 g/dL (ref 3.9–4.9)
Alkaline Phosphatase: 42 IU/L — ABNORMAL LOW (ref 44–121)
BUN/Creatinine Ratio: 17 (ref 9–23)
BUN: 12 mg/dL (ref 6–20)
Bilirubin Total: 0.3 mg/dL (ref 0.0–1.2)
CO2: 20 mmol/L (ref 20–29)
Calcium: 8.7 mg/dL (ref 8.7–10.2)
Chloride: 103 mmol/L (ref 96–106)
Creatinine, Ser: 0.7 mg/dL (ref 0.57–1.00)
Globulin, Total: 2.5 g/dL (ref 1.5–4.5)
Glucose: 88 mg/dL (ref 70–99)
Potassium: 4.9 mmol/L (ref 3.5–5.2)
Sodium: 137 mmol/L (ref 134–144)
Total Protein: 6.5 g/dL (ref 6.0–8.5)
eGFR: 113 mL/min/{1.73_m2} (ref >59)

## 2021-11-22 LAB — LIPID PANEL
Chol/HDL Ratio: 5.2 ratio — ABNORMAL HIGH (ref 0.0–4.4)
Cholesterol, Total: 206 mg/dL — ABNORMAL HIGH (ref 100–199)
HDL: 40 mg/dL (ref >39)
LDL Chol Calc (NIH): 138 mg/dL — ABNORMAL HIGH (ref 0–99)
Triglycerides: 158 mg/dL — ABNORMAL HIGH (ref 0–149)
VLDL Cholesterol Cal: 28 mg/dL (ref 5–40)

## 2021-11-22 LAB — HEMOGLOBIN A1C
Est. average glucose Bld gHb Est-mCnc: 108 mg/dL
Hgb A1c MFr Bld: 5.4 % (ref 4.8–5.6)

## 2021-12-11 ENCOUNTER — Inpatient Hospital Stay: Admission: RE | Admit: 2021-12-11 | Payer: BC Managed Care – PPO | Source: Ambulatory Visit

## 2022-01-01 ENCOUNTER — Ambulatory Visit
Admission: RE | Admit: 2022-01-01 | Discharge: 2022-01-01 | Disposition: A | Discharge status: Home / Self Care | Payer: BC Managed Care – PPO | Source: Ambulatory Visit | Attending: Obstetrics and Gynecology | Admitting: Obstetrics and Gynecology

## 2022-01-01 DIAGNOSIS — Z803 Family history of malignant neoplasm of breast: Secondary | ICD-10-CM | POA: Diagnosis present

## 2022-01-01 DIAGNOSIS — Z1231 Encounter for screening mammogram for malignant neoplasm of breast: Secondary | ICD-10-CM | POA: Diagnosis present

## 2022-01-01 DIAGNOSIS — Z9189 Other specified personal risk factors, not elsewhere classified: Secondary | ICD-10-CM

## 2022-01-01 DIAGNOSIS — N6489 Other specified disorders of breast: Secondary | ICD-10-CM

## 2023-03-04 NOTE — Progress Notes (Unsigned)
PCP:  Pcp, No   No chief complaint on file.    HPI:      Ms. Joan Kelly is a 42 y.o. [id number] whose LMP was No LMP recorded. (Menstrual status: IUD)., presents today for her annual examination.  Her menses are absent with IUD. She does not have intermenstrual bleeding.  Sex activity: single partner, contraception - IUD. Mirena placed 06/11/16 Last Pap: 06/07/19  Results were: no abnormalities /neg HPV DNA . No hx of abn paps  Last mammo: 01/01/22 Results were normal, repeat in 12 months.  There is a FH of breast cancer in her mat aunt and MGM, and possibly ovarian cancer in her [facility]. Genetic testing done by pt 3/21 and was MyRisk neg. IBIS=20.3%/riskscore=35.4%. The patient does not do self-breast exams.  Tobacco use: The patient denies current or previous tobacco use. Alcohol use: none No drug use.  Exercise: occas active  She does get adequate calcium and Vitamin D in her diet. Needs Rx RF metrogel for rosacea. Uses nightly when has a flare or prn.   Pt s/p gastric sleeve surgery 2015. Lost 100#, has gained about 60# back, plateaued for past 2 yrs. Pt interested in wt loss meds. Isn't following same post surgery diet but "not eating that bad". Not taking supp. Occas exercise. Hasn't had recent labs. Has also started to notice LE edema at end of day at work last yr when on feet all day. Sx improve with increased hydration.   Past Medical History:  Diagnosis Date   BRCA negative 05/2019   MyRisk neg   Family history of breast cancer    Family history of ovarian cancer    Increased risk of breast cancer 05/2019   IBIS=20.3%/riskscore=35.4%   Morbid obesity ([facility])     Past Surgical History:  Procedure Laterality Date   BARIATRIC SURGERY  2015   vertical sleeve surgery    Family History  Problem Relation Age of Onset   Pulmonary fibrosis Father    Breast cancer Maternal Aunt 45   Lymphoma Maternal Aunt    Cancer Maternal Aunt 45       spine   Breast  cancer Maternal Grandmother 60   Hypertension Maternal Grandmother    Dementia Maternal Grandmother    Ovarian cancer Maternal Grandmother    Brain cancer Maternal Grandmother 77   Cervical cancer Other        vs ovarian cancer, does not know age   Bipolar disorder Mother    Heart disease Maternal Grandfather    Hyperlipidemia Maternal Grandfather    Pulmonary fibrosis Paternal Aunt     Social History   Socioeconomic History   Marital status: Married    Spouse name: Not on file   Number of children: 2   Years of education: Not on file   Highest education level: Not on file  Occupational History   Occupation: Runner, broadcasting/film/video  Tobacco Use   Smoking status: Never   Smokeless tobacco: Never  Vaping Use   Vaping status: Never Used  Substance and Sexual Activity   Alcohol use: No   Drug use: No   Sexual activity: Yes    Birth control/protection: I.U.D.    Comment: Mirena  Other Topics Concern   Not on file  Social History Narrative   Not on file   Social Determinants of Health   Financial Resource Strain: Not on file  Food Insecurity: Not on file  Transportation Needs: Not on file  Physical Activity: Not on  file  Stress: Not on file  Social Connections: Not on file  Intimate Partner Violence: Not on file     Current Outpatient Medications:    levonorgestrel (MIRENA, 52 MG,) 20 MCG/24HR IUD, 1 Intra Uterine Device (1 each total) by Intrauterine route once., Disp: 1 each, Rfl: 0   metroNIDAZOLE (METROGEL) 0.75 % gel, Apply 1 Application topically at bedtime., Disp: 45 g, Rfl: 1     ROS:  Review of Systems  Constitutional:  Negative for fatigue, fever and unexpected weight change.  Respiratory:  Negative for cough, shortness of breath and wheezing.   Cardiovascular:  Negative for chest pain, palpitations and leg swelling.  Gastrointestinal:  Negative for blood in stool, constipation, diarrhea, nausea and vomiting.  Endocrine: Negative for cold intolerance, heat  intolerance and polyuria.  Genitourinary:  Negative for dyspareunia, dysuria, flank pain, frequency, genital sores, hematuria, menstrual problem, pelvic pain, urgency, vaginal bleeding, vaginal discharge and vaginal pain.  Musculoskeletal:  Negative for back pain, joint swelling and myalgias.  Skin:  Negative for rash.  Neurological:  Negative for dizziness, syncope, light-headedness, numbness and headaches.  Hematological:  Negative for adenopathy.  Psychiatric/Behavioral:  Negative for agitation, confusion, sleep disturbance and suicidal ideas. The patient is not nervous/anxious.   BREAST: No symptoms   Objective: There were no vitals taken for this visit.   Physical Exam Constitutional:      Appearance: She is well-developed.  Genitourinary:     Vulva normal.     Right Labia: No rash, tenderness or lesions.    Left Labia: No tenderness, lesions or rash.    No vaginal discharge, erythema or tenderness.      Right Adnexa: not tender and no mass present.    Left Adnexa: not tender and no mass present.    No cervical friability or polyp.     IUD strings visualized.     Uterus is not enlarged or tender.  Breasts:    Right: No mass, nipple discharge, skin change or tenderness.     Left: No mass, nipple discharge, skin change or tenderness.  Neck:     Thyroid: No thyromegaly.  Cardiovascular:     Rate and Rhythm: Normal rate and regular rhythm.     Heart sounds: Normal heart sounds. No murmur heard. Pulmonary:     Effort: Pulmonary effort is normal.     Breath sounds: Normal breath sounds.  Abdominal:     Palpations: Abdomen is soft.     Tenderness: There is no abdominal tenderness. There is no guarding or rebound.  Musculoskeletal:        General: Normal range of motion.     Cervical back: Normal range of motion.  Lymphadenopathy:     Cervical: No cervical adenopathy.  Neurological:     General: No focal deficit present.     Mental Status: She is alert and oriented to  person, place, and time.     Cranial Nerves: No cranial nerve deficit.  Skin:    General: Skin is warm and dry.  Psychiatric:        Mood and Affect: Mood normal.        Behavior: Behavior normal.        Thought Content: Thought content normal.        Judgment: Judgment normal.  Vitals reviewed.     Assessment/Plan: Encounter for annual routine gynecological examination  Encounter for routine checking of intrauterine contraceptive device (IUD)--IUD has 8 yr indication, IUD strings in cx os.  Encounter for screening mammogram for malignant neoplasm of breast - Plan: MM DIAG BREAST TOMO BILATERAL; pt to schedule mammo  Breast asymmetry - Plan: MM DIAG BREAST TOMO BILATERAL  Family history of breast cancer - Plan: MM DIAG BREAST TOMO BILATERAL; Pt is MyRisk neg  Increased risk of breast cancer - Plan: MM DIAG BREAST TOMO BILATERAL; recommended monthly SBE, yearly CBE and mammos, as well as scr breast MRI. Pt to do mammo first and then f/u for MRI ref prn. Cont Vit D supp  Rosacea - Plan: metroNIDAZOLE (METROGEL) 0.75 % gel; Rx RF.  BMI 40.0-44.9, adult (HCC) - Plan: Comprehensive metabolic panel, Lipid panel, Hemoglobin A1c; check labs. If WNL, pt to f/u with PCP or can refer to [facility] wt loss for Rx meds. Resume post surg diet in meantime, as well as bariatric vits.   Blood tests for routine general physical examination - Plan: Comprehensive metabolic panel, Lipid panel, Hemoglobin A1c  Screening cholesterol level - Plan: Lipid panel  Screening for diabetes mellitus - Plan: Hemoglobin A1c   No orders of the defined types were placed in this encounter.            GYN counsel breast self exam, mammography screening, adequate intake of calcium and vitamin D, diet and exercise     F/U  No follow-ups on file.  Joan Kelly B. Mykah Bellomo, PA-C 03/04/2023 3:42 PM

## 2023-03-06 ENCOUNTER — Encounter: Payer: Self-pay | Admitting: Obstetrics and Gynecology

## 2023-03-06 ENCOUNTER — Ambulatory Visit (INDEPENDENT_AMBULATORY_CARE_PROVIDER_SITE_OTHER): Payer: BC Managed Care – PPO | Admitting: Obstetrics and Gynecology

## 2023-03-06 ENCOUNTER — Other Ambulatory Visit (HOSPITAL_COMMUNITY)
Admission: RE | Admit: 2023-03-06 | Discharge: 2023-03-06 | Disposition: A | Discharge status: Home / Self Care | Payer: BC Managed Care – PPO | Source: Ambulatory Visit | Attending: Obstetrics and Gynecology | Admitting: Obstetrics and Gynecology

## 2023-03-06 VITALS — BP 111/70 | HR 69 | Ht 66.0 in | Wt 257.0 lb

## 2023-03-06 DIAGNOSIS — R682 Dry mouth, unspecified: Secondary | ICD-10-CM

## 2023-03-06 DIAGNOSIS — Z01419 Encounter for gynecological examination (general) (routine) without abnormal findings: Secondary | ICD-10-CM

## 2023-03-06 DIAGNOSIS — Z124 Encounter for screening for malignant neoplasm of cervix: Secondary | ICD-10-CM

## 2023-03-06 DIAGNOSIS — Z803 Family history of malignant neoplasm of breast: Secondary | ICD-10-CM

## 2023-03-06 DIAGNOSIS — Z1151 Encounter for screening for human papillomavirus (HPV): Secondary | ICD-10-CM | POA: Diagnosis present

## 2023-03-06 DIAGNOSIS — Z9884 Bariatric surgery status: Secondary | ICD-10-CM

## 2023-03-06 DIAGNOSIS — Z131 Encounter for screening for diabetes mellitus: Secondary | ICD-10-CM

## 2023-03-06 DIAGNOSIS — Z0000 Encounter for general adult medical examination without abnormal findings: Secondary | ICD-10-CM

## 2023-03-06 DIAGNOSIS — Z13220 Encounter for screening for lipoid disorders: Secondary | ICD-10-CM

## 2023-03-06 DIAGNOSIS — Z1231 Encounter for screening mammogram for malignant neoplasm of breast: Secondary | ICD-10-CM

## 2023-03-06 DIAGNOSIS — Z30431 Encounter for routine checking of intrauterine contraceptive device: Secondary | ICD-10-CM

## 2023-03-06 DIAGNOSIS — Z6841 Body Mass Index (BMI) 40.0 and over, adult: Secondary | ICD-10-CM

## 2023-03-06 DIAGNOSIS — Z130 Encounter for screening for diseases of the blood and blood-forming organs and certain disorders involving the immune mechanism: Secondary | ICD-10-CM

## 2023-03-06 DIAGNOSIS — Z9189 Other specified personal risk factors, not elsewhere classified: Secondary | ICD-10-CM

## 2023-03-06 DIAGNOSIS — L719 Rosacea, unspecified: Secondary | ICD-10-CM

## 2023-03-06 DIAGNOSIS — Z1329 Encounter for screening for other suspected endocrine disorder: Secondary | ICD-10-CM

## 2023-03-06 MED ORDER — METRONIDAZOLE 0.75 % EX GEL: 1.0000 | Freq: Every day | CUTANEOUS | 1 refills | Status: AC

## 2023-03-06 NOTE — Patient Instructions (Signed)
I value your feedback and you entrusting us with your care. If you get a [facility] patient survey, I would appreciate you taking the time to let us know about your experience today. Thank you!  [facility] ([place])--[phone]  

## 2023-03-11 LAB — CYTOLOGY - PAP
Comment: NEGATIVE
Diagnosis: NEGATIVE
High risk HPV: NEGATIVE

## 2023-03-20 ENCOUNTER — Other Ambulatory Visit: Payer: BC Managed Care – PPO

## 2023-03-20 DIAGNOSIS — Z1329 Encounter for screening for other suspected endocrine disorder: Secondary | ICD-10-CM

## 2023-03-20 DIAGNOSIS — Z131 Encounter for screening for diabetes mellitus: Secondary | ICD-10-CM

## 2023-03-20 DIAGNOSIS — Z6841 Body Mass Index (BMI) 40.0 and over, adult: Secondary | ICD-10-CM

## 2023-03-20 DIAGNOSIS — Z130 Encounter for screening for diseases of the blood and blood-forming organs and certain disorders involving the immune mechanism: Secondary | ICD-10-CM

## 2023-03-20 DIAGNOSIS — Z9884 Bariatric surgery status: Secondary | ICD-10-CM

## 2023-03-20 DIAGNOSIS — Z0000 Encounter for general adult medical examination without abnormal findings: Secondary | ICD-10-CM

## 2023-03-20 DIAGNOSIS — Z13220 Encounter for screening for lipoid disorders: Secondary | ICD-10-CM

## 2023-03-21 LAB — COMPREHENSIVE METABOLIC PANEL
ALT: 26 [IU]/L (ref 0–32)
AST: 23 [IU]/L (ref 0–40)
Albumin: 3.9 g/dL (ref 3.9–4.9)
Alkaline Phosphatase: 53 [IU]/L (ref 44–121)
BUN/Creatinine Ratio: 15 (ref 9–23)
BUN: 10 mg/dL (ref 6–24)
Bilirubin Total: 0.3 mg/dL (ref 0.0–1.2)
CO2: 23 mmol/L (ref 20–29)
Calcium: 8.9 mg/dL (ref 8.7–10.2)
Chloride: 103 mmol/L (ref 96–106)
Creatinine, Ser: 0.65 mg/dL (ref 0.57–1.00)
Globulin, Total: 2.6 g/dL (ref 1.5–4.5)
Glucose: 98 mg/dL (ref 70–99)
Potassium: 4.5 mmol/L (ref 3.5–5.2)
Sodium: 137 mmol/L (ref 134–144)
Total Protein: 6.5 g/dL (ref 6.0–8.5)
eGFR: 113 mL/min/{1.73_m2} (ref >59)

## 2023-03-21 LAB — B12 AND FOLATE PANEL
Folate: 11.3 ng/mL (ref >3.0)
Vitamin B-12: 472 pg/mL (ref 232–1245)

## 2023-03-21 LAB — CBC WITH DIFFERENTIAL/PLATELET
Basophils Absolute: 0 10*3/uL (ref 0.0–0.2)
Basos: 1 %
EOS (ABSOLUTE): 0.4 10*3/uL (ref 0.0–0.4)
Eos: 7 %
Hematocrit: 44.4 % (ref 34.0–46.6)
Hemoglobin: 14.4 g/dL (ref 11.1–15.9)
Immature Grans (Abs): 0 10*3/uL (ref 0.0–0.1)
Immature Granulocytes: 0 %
Lymphocytes Absolute: 2.4 10*3/uL (ref 0.7–3.1)
Lymphs: 45 %
MCH: 28 pg (ref 26.6–33.0)
MCHC: 32.4 g/dL (ref 31.5–35.7)
MCV: 86 fL (ref 79–97)
Monocytes Absolute: 0.3 10*3/uL (ref 0.1–0.9)
Monocytes: 6 %
Neutrophils Absolute: 2.2 10*3/uL (ref 1.4–7.0)
Neutrophils: 41 %
Platelets: 238 10*3/uL (ref 150–450)
RBC: 5.14 x10E6/uL (ref 3.77–5.28)
RDW: 13.9 % (ref 11.7–15.4)
WBC: 5.3 10*3/uL (ref 3.4–10.8)

## 2023-03-21 LAB — TSH+FREE T4
Free T4: 1.15 ng/dL (ref 0.82–1.77)
TSH: 2.59 u[IU]/mL (ref 0.450–4.500)

## 2023-03-21 LAB — LIPID PANEL
Chol/HDL Ratio: 5 {ratio} — ABNORMAL HIGH (ref 0.0–4.4)
Cholesterol, Total: 223 mg/dL — ABNORMAL HIGH (ref 100–199)
HDL: 45 mg/dL (ref >39)
LDL Chol Calc (NIH): 138 mg/dL — ABNORMAL HIGH (ref 0–99)
Triglycerides: 221 mg/dL — ABNORMAL HIGH (ref 0–149)
VLDL Cholesterol Cal: 40 mg/dL (ref 5–40)

## 2023-03-21 LAB — HEMOGLOBIN A1C
Est. average glucose Bld gHb Est-mCnc: 105 mg/dL
Hgb A1c MFr Bld: 5.3 % (ref 4.8–5.6)

## 2023-03-21 LAB — VITAMIN D 25 HYDROXY (VIT D DEFICIENCY, FRACTURES): Vit D, 25-Hydroxy: 22.5 ng/mL — ABNORMAL LOW (ref 30.0–100.0)

## 2023-03-28 ENCOUNTER — Ambulatory Visit
Admission: RE | Admit: 2023-03-28 | Discharge: 2023-03-28 | Disposition: A | Discharge status: Home / Self Care | Payer: 59 | Source: Ambulatory Visit | Attending: Obstetrics and Gynecology | Admitting: Obstetrics and Gynecology

## 2023-03-28 DIAGNOSIS — Z9189 Other specified personal risk factors, not elsewhere classified: Secondary | ICD-10-CM | POA: Insufficient documentation

## 2023-03-28 DIAGNOSIS — Z803 Family history of malignant neoplasm of breast: Secondary | ICD-10-CM | POA: Diagnosis present

## 2023-03-28 DIAGNOSIS — Z1231 Encounter for screening mammogram for malignant neoplasm of breast: Secondary | ICD-10-CM | POA: Diagnosis present

## 2024-04-12 NOTE — Progress Notes (Unsigned)
 "   Gynecology Annual Exam  PCP: Pcp, No  Chief Complaint: No chief complaint on file.   History of Present Illness: Patient is a 44 y.o. Joan Kelly presents for annual exam. The patient has no complaints today.   LMP: No LMP recorded. (Menstrual status: IUD). Average Interval: {Desc; regular/irreg:14544}, {numbers 22-35:14824} days Duration of flow: {numbers; 0-10:33138} days Heavy Menses: {yes/no:63} Clots: {yes/no:63} Intermenstrual Bleeding: {yes/no:63} Postcoital Bleeding: {yes/no:63} Dysmenorrhea: {yes/no:63}   The patient {sys sexually active:13135} sexually active. She currently uses {method:5051} for contraception. She {has/denies:315300} dyspareunia.  The patient {DOES_DOES WNU:81435} perform self breast exams.  There {is/is no:19420} notable family history of breast or ovarian cancer in her family.  The patient wears seatbelts: {yes/no:63}.   The patient has regular exercise: {yes/no/not asked:9010}.    The patient {Blank single:19197::reports,denies} current symptoms of depression.    Review of Systems: ROS  Past Medical History:  Patient Active Problem List   Diagnosis Date Noted Date Diagnosed   Increased risk of breast cancer 08/14/2020    Breast asymmetry 08/14/2020    Rosacea 08/14/2020    Family history of breast cancer 06/07/2019    Contraceptive management 06/06/2016    History of bariatric surgery 06/06/2016     02/2014 gastric sleeve     Past Surgical History:  Past Surgical History:  Procedure Laterality Date   BARIATRIC SURGERY  2015   vertical sleeve surgery    Gynecologic History:  No LMP recorded. (Menstrual status: IUD). Contraception: {method:5051} Last Pap: Results were: *** {Findings; lab pap smear results:16707::NIL and HR HPV+,NIL and HR HPV negative}  Last mammogram: *** Results were: {Blank single:19197::***,BI-RADS IV,BI-RAD III,BI-RAD II,BI-RAD I}  Obstetric History: Joan Kelly  Family History:  Family History   Problem Relation Age of Onset   Pulmonary fibrosis Father    Breast cancer Maternal Aunt 45   Lymphoma Maternal Aunt    Cancer Maternal Aunt 45       spine   Breast cancer Maternal Grandmother 60   Hypertension Maternal Grandmother    Dementia Maternal Grandmother    Ovarian cancer Maternal Grandmother    Brain cancer Maternal Grandmother 82   Cervical cancer Other        vs ovarian cancer, does not know age   Bipolar disorder Mother    Heart disease Maternal Grandfather    Hyperlipidemia Maternal Grandfather    Pulmonary fibrosis Paternal Aunt     Social History:  Social History   Socioeconomic History   Marital status: Married    Spouse name: Not on file   Number of children: 2   Years of education: Not on file   Highest education level: Not on file  Occupational History   Occupation: runner, broadcasting/film/video  Tobacco Use   Smoking status: Never   Smokeless tobacco: Never  Vaping Use   Vaping status: Never Used  Substance and Sexual Activity   Alcohol use: Yes    Comment: socially   Drug use: No   Sexual activity: Yes    Birth control/protection: I.U.D.    Comment: Mirena   Other Topics Concern   Not on file  Social History Narrative   Not on file   Social Drivers of Health   Tobacco Use: Low Risk (03/28/2023)   Patient History    Smoking Tobacco Use: Never    Smokeless Tobacco Use: Never    Passive Exposure: Not on file  Financial Resource Strain: Not on file  Food Insecurity: Not on file  Transportation Needs: Not on file  Physical Activity: Not on file  Stress: Not on file  Social Connections: Not on file  Intimate Partner Violence: Not on file  Depression (EYV7-0): Not on file  Alcohol Screen: Not on file  Housing: Not on file  Utilities: Not on file  Health Literacy: Not on file    Allergies:  Allergies[1]  Medications: Prior to Admission medications  Medication Sig Start Date End Date Taking? Authorizing Provider  levonorgestrel  (MIRENA , 52 MG,) 20  MCG/24HR IUD 1 Intra Uterine Device (1 each total) by Intrauterine route once. 06/06/16 06/06/16  Gutierrez, Colleen, CNM  metroNIDAZOLE  (METROGEL ) 0.75 % gel Apply 1 Application topically at bedtime. 03/06/23   Copland, Alicia B, PA-C  Multiple Vitamin (MULTI VITAMIN PO)     [provider]    Physical Exam Vitals: There were no vitals taken for this visit.  General: NAD HEENT: normocephalic, anicteric Thyroid: no enlargement, no palpable nodules Pulmonary: No increased work of breathing, CTAB Cardiovascular: RRR, distal pulses 2+ Breast: Breast symmetrical, no tenderness, no palpable nodules or masses, no skin or nipple retraction present, no nipple discharge.  No axillary or supraclavicular lymphadenopathy. Abdomen: NABS, soft, non-tender, non-distended.  Umbilicus without lesions.  No hepatomegaly, splenomegaly or masses palpable. No evidence of hernia  Genitourinary:  External: Normal external female genitalia.  Normal urethral meatus, normal Bartholin's and Skene's glands.    Vagina: Normal vaginal mucosa, no evidence of prolapse.    Cervix: Grossly normal in appearance, no bleeding  Uterus: Non-enlarged, mobile, normal contour.  No CMT  Adnexa: ovaries non-enlarged, no adnexal masses  Rectal: deferred  Lymphatic: no evidence of inguinal lymphadenopathy Extremities: no edema, erythema, or tenderness Neurologic: Grossly intact Psychiatric: mood appropriate, affect full  Female chaperone present for pelvic and breast  portions of the physical exam    Assessment: 44 y.o. G2P2002 routine annual exam  Plan: Problem List Items Addressed This Visit   None   1) Mammogram - recommend yearly screening mammogram.  Mammogram {Blank single:19197::Is up to date,Was ordered today}   2) STI screening  {Blank single:19197::was,was not}offered and {Blank single:19197::accepted,declined,therefore not obtained}  3) ASCCP guidelines and rational discussed.  Patient  opts for {Blank single:19197::***,every 5 years,every 3 years,yearly,discontinue age >65,discontinue secondary to prior hysterectomy} screening interval  4) Contraception - the patient is currently using  {method:5051}.  She is {Blank single:19197::happy with her current form of contraception and plans to continue,interested in changing to ***,interested in starting Contraception: ***,not currently in need of contraception secondary to being sterile,attempting to conceive in the near future}  5) Colonoscopy -- Screening recommended starting at age 61 for average risk individuals, age 73 for individuals deemed at increased risk (including African Americans) and recommended to continue until age 69.  For patient age 84-85 individualized approach is recommended.  Gold standard screening is via colonoscopy, Cologuard screening is an acceptable alternative for patient unwilling or unable to undergo colonoscopy.  Colorectal cancer screening for average?risk adults: 2018 guideline update from the American Cancer SocietyCA: A Cancer Journal for Clinicians: Aug 21, 2016   6) Routine healthcare maintenance including cholesterol, diabetes screening discussed {Blank single:19197::managed by PCP,Ordered today,To return fasting at a later date,Declines}  7) No follow-ups on file.  Jinnie Cookey, CNM  [facility] 04/12/2024, 2:26 PM            [1] No Known Allergies  "

## 2024-04-13 ENCOUNTER — Ambulatory Visit: Admitting: Licensed Practical Nurse

## 2024-04-13 ENCOUNTER — Encounter: Payer: Self-pay | Admitting: Licensed Practical Nurse

## 2024-04-13 VITALS — BP 105/81 | HR 84 | Ht 66.0 in | Wt 236.5 lb

## 2024-04-13 DIAGNOSIS — Z30432 Encounter for removal of intrauterine contraceptive device: Secondary | ICD-10-CM

## 2024-04-13 DIAGNOSIS — Z9884 Bariatric surgery status: Secondary | ICD-10-CM | POA: Diagnosis not present

## 2024-04-13 DIAGNOSIS — Z01419 Encounter for gynecological examination (general) (routine) without abnormal findings: Secondary | ICD-10-CM

## 2024-04-13 DIAGNOSIS — Z1239 Encounter for other screening for malignant neoplasm of breast: Secondary | ICD-10-CM

## 2024-04-13 DIAGNOSIS — Z131 Encounter for screening for diabetes mellitus: Secondary | ICD-10-CM

## 2024-04-13 DIAGNOSIS — Z1231 Encounter for screening mammogram for malignant neoplasm of breast: Secondary | ICD-10-CM

## 2024-04-13 DIAGNOSIS — Z13220 Encounter for screening for lipoid disorders: Secondary | ICD-10-CM

## 2024-04-14 LAB — COMPREHENSIVE METABOLIC PANEL WITH GFR
ALT: 20 IU/L (ref 0–32)
AST: 24 IU/L (ref 0–40)
Albumin: 4.1 g/dL (ref 3.9–4.9)
Alkaline Phosphatase: 48 IU/L (ref 41–116)
BUN/Creatinine Ratio: 26 — ABNORMAL HIGH (ref 9–23)
BUN: 21 mg/dL (ref 6–24)
Bilirubin Total: 0.4 mg/dL (ref 0.0–1.2)
CO2: 23 mmol/L (ref 20–29)
Calcium: 9.7 mg/dL (ref 8.7–10.2)
Chloride: 101 mmol/L (ref 96–106)
Creatinine, Ser: 0.81 mg/dL (ref 0.57–1.00)
Globulin, Total: 2.5 g/dL (ref 1.5–4.5)
Glucose: 93 mg/dL (ref 70–99)
Potassium: 4.8 mmol/L (ref 3.5–5.2)
Sodium: 137 mmol/L (ref 134–144)
Total Protein: 6.6 g/dL (ref 6.0–8.5)
eGFR: 93 mL/min/1.73

## 2024-04-14 LAB — TSH+FREE T4
Free T4: 1.07 ng/dL (ref 0.82–1.77)
TSH: 2.21 u[IU]/mL (ref 0.450–4.500)

## 2024-04-14 LAB — CBC WITH DIFFERENTIAL/PLATELET
Basophils Absolute: 0.1 x10E3/uL (ref 0.0–0.2)
Basos: 1 %
EOS (ABSOLUTE): 0.2 x10E3/uL (ref 0.0–0.4)
Eos: 4 %
Hematocrit: 45.4 % (ref 34.0–46.6)
Hemoglobin: 14.9 g/dL (ref 11.1–15.9)
Immature Grans (Abs): 0 x10E3/uL (ref 0.0–0.1)
Immature Granulocytes: 0 %
Lymphocytes Absolute: 2.4 x10E3/uL (ref 0.7–3.1)
Lymphs: 42 %
MCH: 28.7 pg (ref 26.6–33.0)
MCHC: 32.8 g/dL (ref 31.5–35.7)
MCV: 87 fL (ref 79–97)
Monocytes Absolute: 0.4 x10E3/uL (ref 0.1–0.9)
Monocytes: 7 %
Neutrophils Absolute: 2.7 x10E3/uL (ref 1.4–7.0)
Neutrophils: 46 %
Platelets: 231 x10E3/uL (ref 150–450)
RBC: 5.2 x10E6/uL (ref 3.77–5.28)
RDW: 13.8 % (ref 11.7–15.4)
WBC: 5.8 x10E3/uL (ref 3.4–10.8)

## 2024-04-14 LAB — VITAMIN D 25 HYDROXY (VIT D DEFICIENCY, FRACTURES): Vit D, 25-Hydroxy: 28.4 ng/mL — ABNORMAL LOW (ref 30.0–100.0)

## 2024-04-14 LAB — HEMOGLOBIN A1C
Est. average glucose Bld gHb Est-mCnc: 100 mg/dL
Hgb A1c MFr Bld: 5.1 % (ref 4.8–5.6)

## 2024-04-14 LAB — B12 AND FOLATE PANEL
Folate: >20 ng/mL
Vitamin B-12: 885 pg/mL (ref 232–1245)

## 2024-04-14 LAB — LIPID PANEL
Chol/HDL Ratio: 5.1 ratio — ABNORMAL HIGH (ref 0.0–4.4)
Cholesterol, Total: 214 mg/dL — ABNORMAL HIGH (ref 100–199)
HDL: 42 mg/dL
LDL Chol Calc (NIH): 133 mg/dL — ABNORMAL HIGH (ref 0–99)
Triglycerides: 216 mg/dL — ABNORMAL HIGH (ref 0–149)
VLDL Cholesterol Cal: 39 mg/dL (ref 5–40)

## 2024-04-15 ENCOUNTER — Encounter: Payer: Self-pay | Admitting: Licensed Practical Nurse

## 2024-04-16 ENCOUNTER — Other Ambulatory Visit: Payer: Self-pay | Admitting: Licensed Practical Nurse

## 2024-04-16 DIAGNOSIS — Z1231 Encounter for screening mammogram for malignant neoplasm of breast: Secondary | ICD-10-CM

## 2024-04-21 ENCOUNTER — Ambulatory Visit: Payer: Self-pay | Admitting: Licensed Practical Nurse

## 2024-05-11 ENCOUNTER — Encounter
